# Patient Record
Sex: Female | Born: 1990 | Race: White | Hispanic: No | Smoking: Never smoker
Health system: Southern US, Community
[De-identification: ages and names within clinical notes are randomized; demographics above are authoritative.]

---

## 2015-03-06 ENCOUNTER — Encounter (HOSPITAL_COMMUNITY): Payer: Self-pay | Admitting: Emergency Medicine

## 2015-03-06 ENCOUNTER — Emergency Department (HOSPITAL_COMMUNITY): Payer: No Typology Code available for payment source

## 2015-03-06 ENCOUNTER — Emergency Department (HOSPITAL_COMMUNITY)
Admission: EM | Admit: 2015-03-06 | Discharge: 2015-03-06 | Disposition: A | Discharge status: Home / Self Care | Payer: No Typology Code available for payment source | Attending: Emergency Medicine | Admitting: Emergency Medicine

## 2015-03-06 DIAGNOSIS — Y92410 Unspecified street and highway as the place of occurrence of the external cause: Secondary | ICD-10-CM | POA: Diagnosis not present

## 2015-03-06 DIAGNOSIS — S299XXA Unspecified injury of thorax, initial encounter: Secondary | ICD-10-CM | POA: Insufficient documentation

## 2015-03-06 DIAGNOSIS — S79922A Unspecified injury of left thigh, initial encounter: Secondary | ICD-10-CM | POA: Diagnosis not present

## 2015-03-06 DIAGNOSIS — Y9389 Activity, other specified: Secondary | ICD-10-CM | POA: Diagnosis not present

## 2015-03-06 DIAGNOSIS — R0781 Pleurodynia: Secondary | ICD-10-CM

## 2015-03-06 DIAGNOSIS — Y998 Other external cause status: Secondary | ICD-10-CM | POA: Insufficient documentation

## 2015-03-06 DIAGNOSIS — S79912A Unspecified injury of left hip, initial encounter: Secondary | ICD-10-CM | POA: Diagnosis not present

## 2015-03-06 MED ORDER — METHOCARBAMOL 500 MG PO TABS: 500.0000 mg | ORAL_TABLET | Freq: Two times a day (BID) | ORAL | Status: AC | PRN

## 2015-03-06 MED ORDER — ACETAMINOPHEN 325 MG PO TABS
650.0000 mg | ORAL_TABLET | Freq: Once | ORAL | Status: AC
  Administered 2015-03-06: 650 mg via ORAL
  Filled 2015-03-06: qty 2

## 2015-03-06 MED ORDER — NAPROXEN 250 MG PO TABS: 250.0000 mg | ORAL_TABLET | Freq: Two times a day (BID) | ORAL | Status: AC
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
